# Patient Record
Sex: Female | Born: 1960 | Race: White | Hispanic: No | Marital: Married
Health system: Southern US, Community
[De-identification: ages and names within clinical notes are randomized; demographics above are authoritative.]

## PROBLEM LIST (undated history)

## (undated) DIAGNOSIS — I10 Essential (primary) hypertension: Secondary | ICD-10-CM

## (undated) DIAGNOSIS — R7303 Prediabetes: Secondary | ICD-10-CM

## (undated) HISTORY — PX: CHOLECYSTECTOMY: SHX55

## (undated) HISTORY — PX: OTHER SURGICAL HISTORY: SHX169

---

## 2005-02-20 ENCOUNTER — Ambulatory Visit: Payer: Self-pay | Admitting: Family Medicine

## 2006-04-16 ENCOUNTER — Ambulatory Visit: Payer: Self-pay | Admitting: Family Medicine

## 2007-05-04 ENCOUNTER — Other Ambulatory Visit: Payer: Self-pay

## 2007-05-04 ENCOUNTER — Emergency Department: Payer: Self-pay | Admitting: Emergency Medicine

## 2007-06-20 ENCOUNTER — Ambulatory Visit: Payer: Self-pay | Admitting: Gastroenterology

## 2007-06-21 ENCOUNTER — Ambulatory Visit: Payer: Self-pay | Admitting: Family Medicine

## 2008-02-18 IMAGING — CR RIGHT GREAT TOE
1 series · 3 of 3 positions shown · non-contrast
Comparison: none

REASON FOR EXAM: Trauma: swelling and pain R Great Toe
COMMENTS:   LMP: > one month ago

PROCEDURE:     DXR - DXR TOE GREAT (1ST DIGIT) RT PANDITA  - May 04, 2007 [DATE]
RESULT:     Comparison: No available comparison exam.

[Series 1: view not recorded · 0.17mm/px · 3 of 3 slices shown]
[im 1/3]
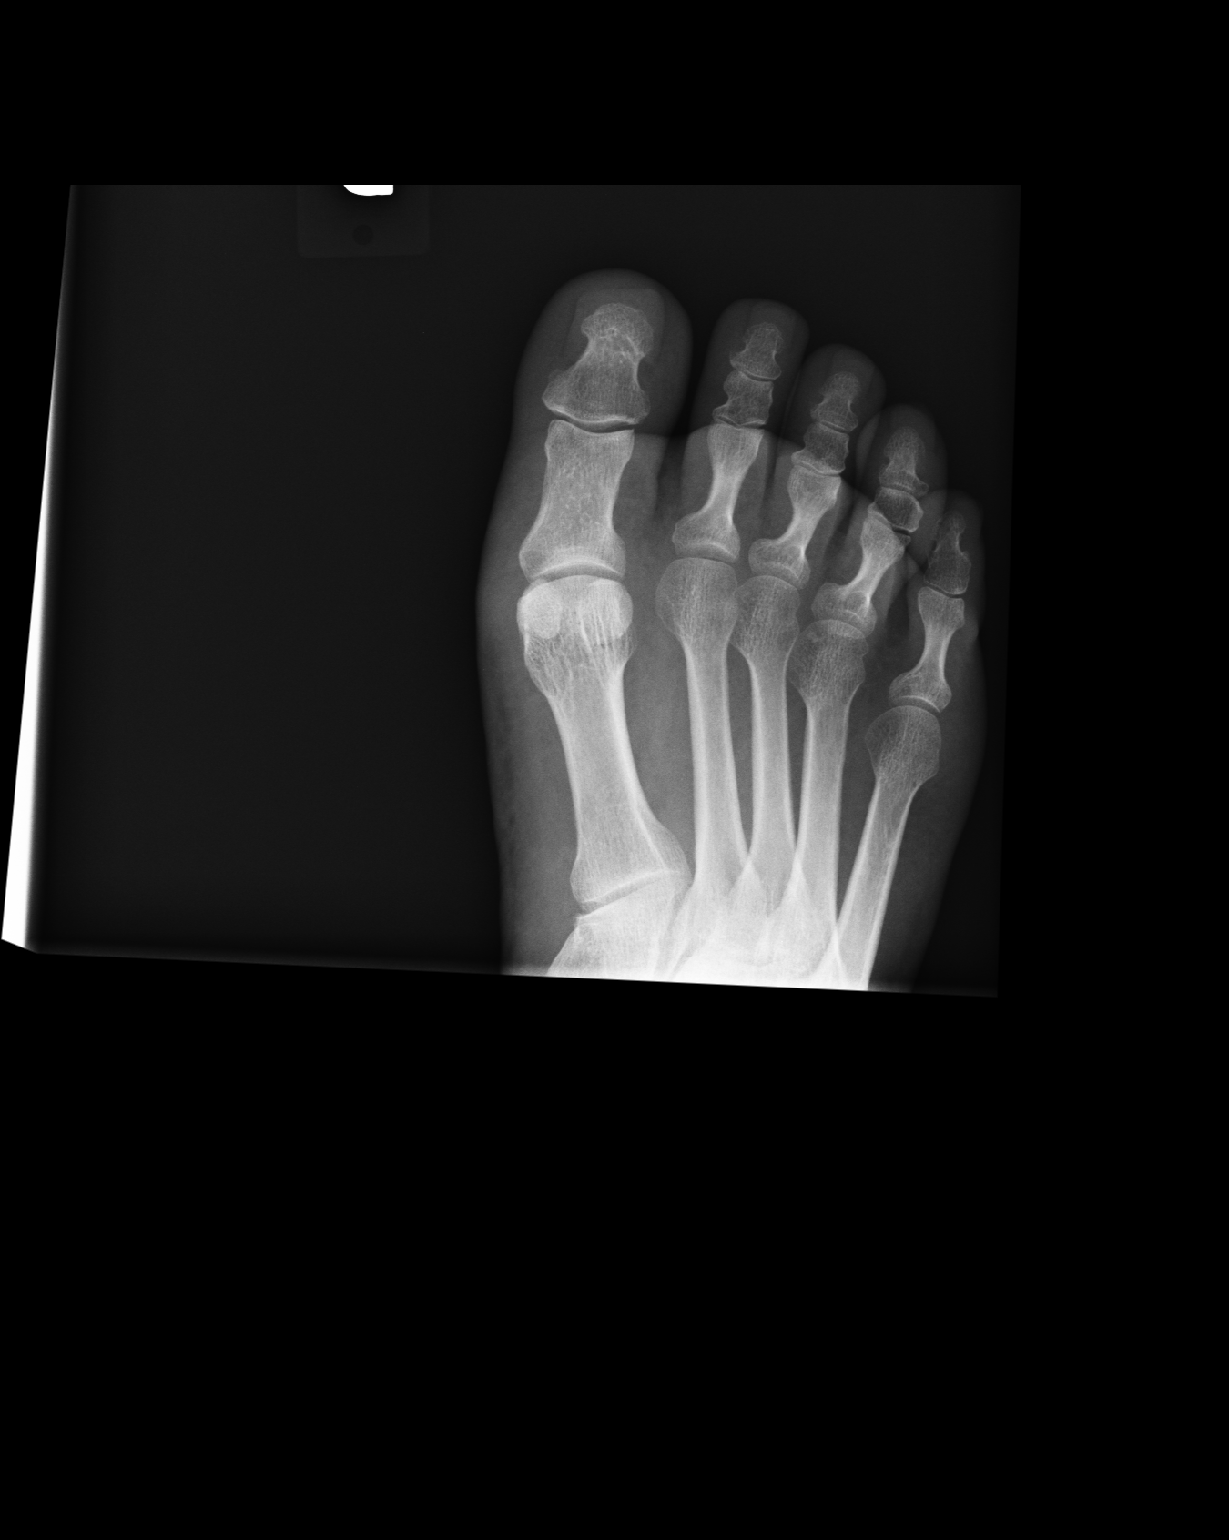
[im 2/3]
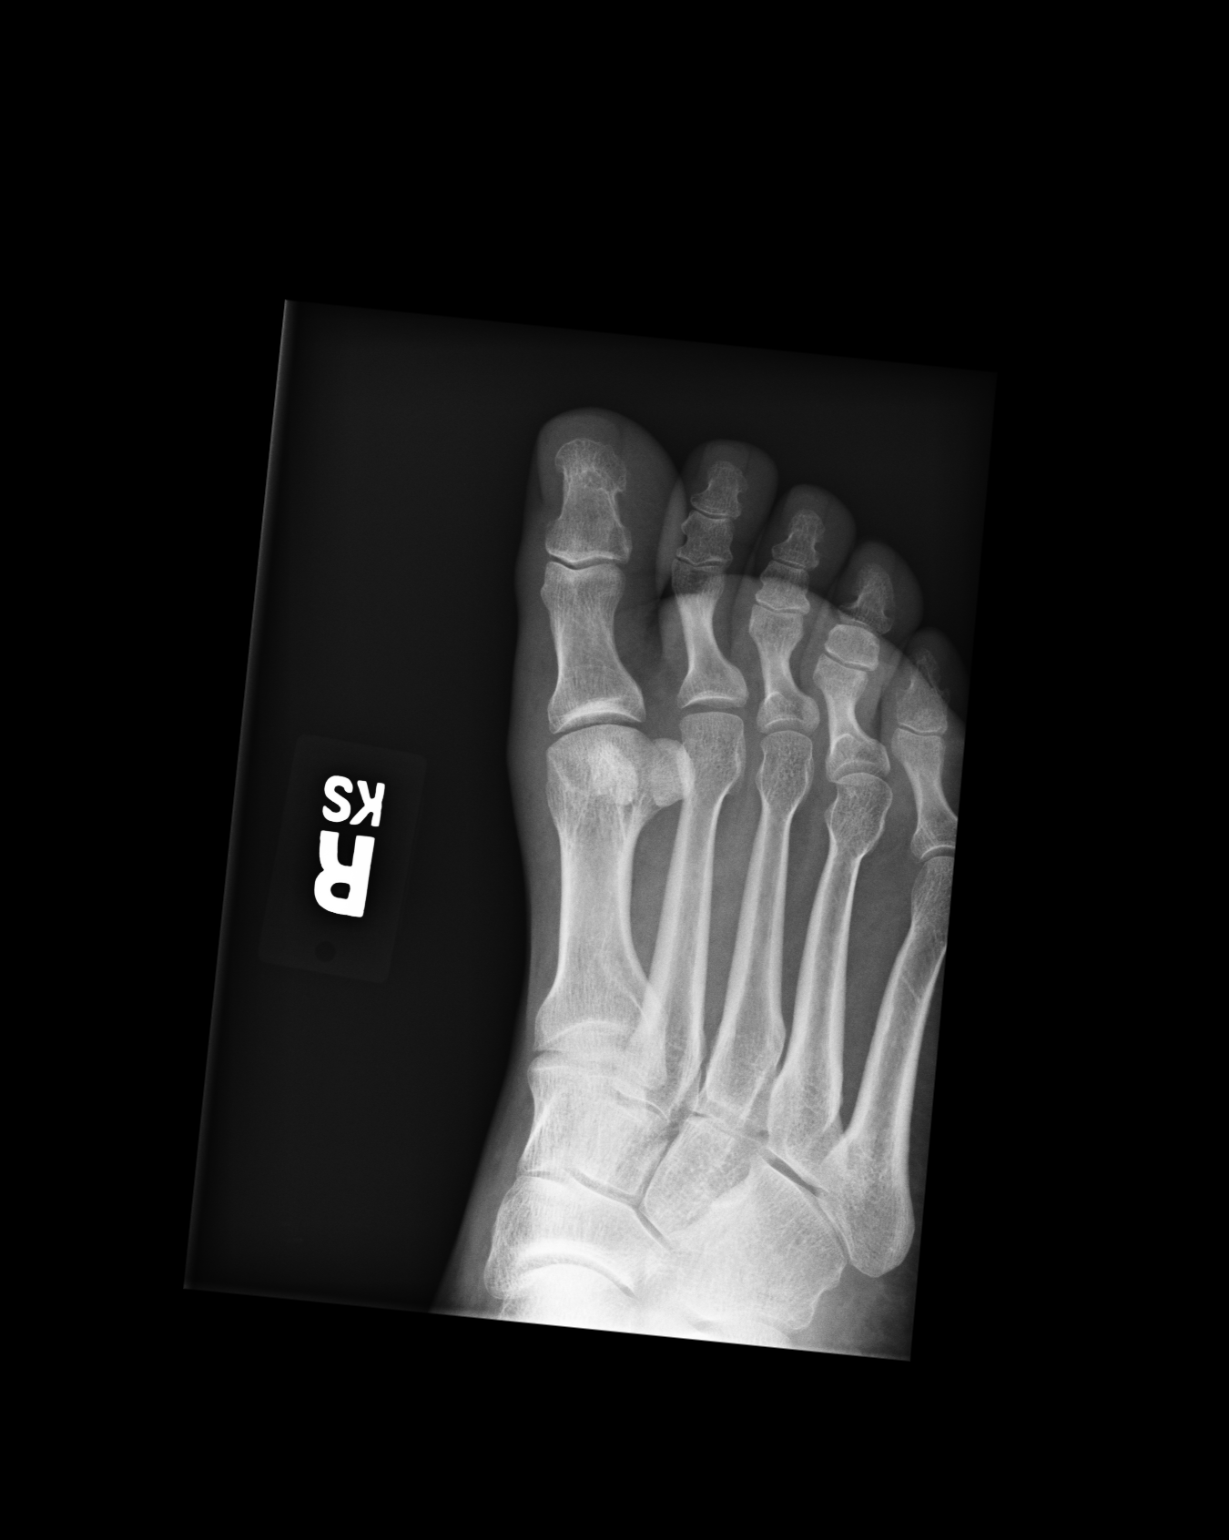
[im 3/3]
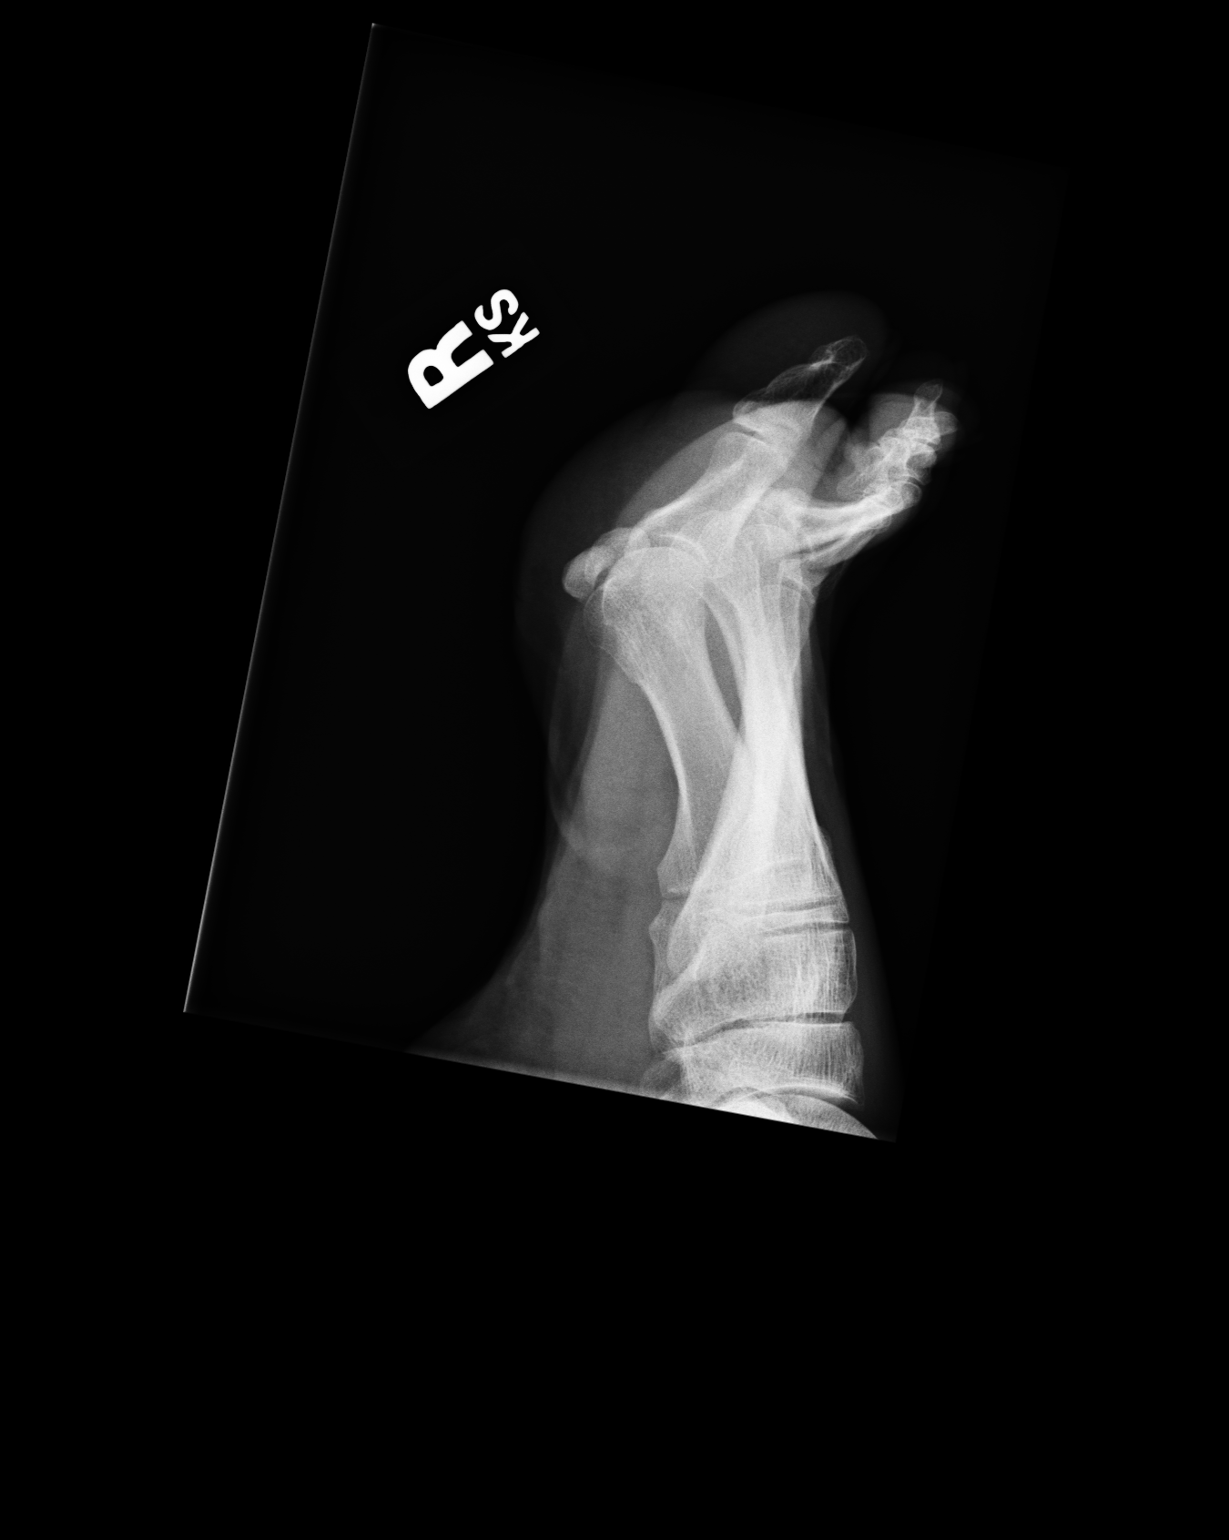

[3 of 3 positions shown; findings below may reference images not displayed]

FINDINGS: Three views of the right toes were obtained.

No fracture or dislocation of the right toes is noted. No significant joint
abnormality is seen.
IMPRESSION: 1. Please see findings.

## 2008-12-18 ENCOUNTER — Ambulatory Visit: Payer: Self-pay | Admitting: Family Medicine

## 2009-10-04 IMAGING — CR DG CHEST 2V
1 series · 2 of 2 positions shown · non-contrast
Comparison: none

REASON FOR EXAM: cough
COMMENTS:

[Series 1: view not recorded · 0.17mm/px · 2 of 2 slices shown]
[im 1/2]
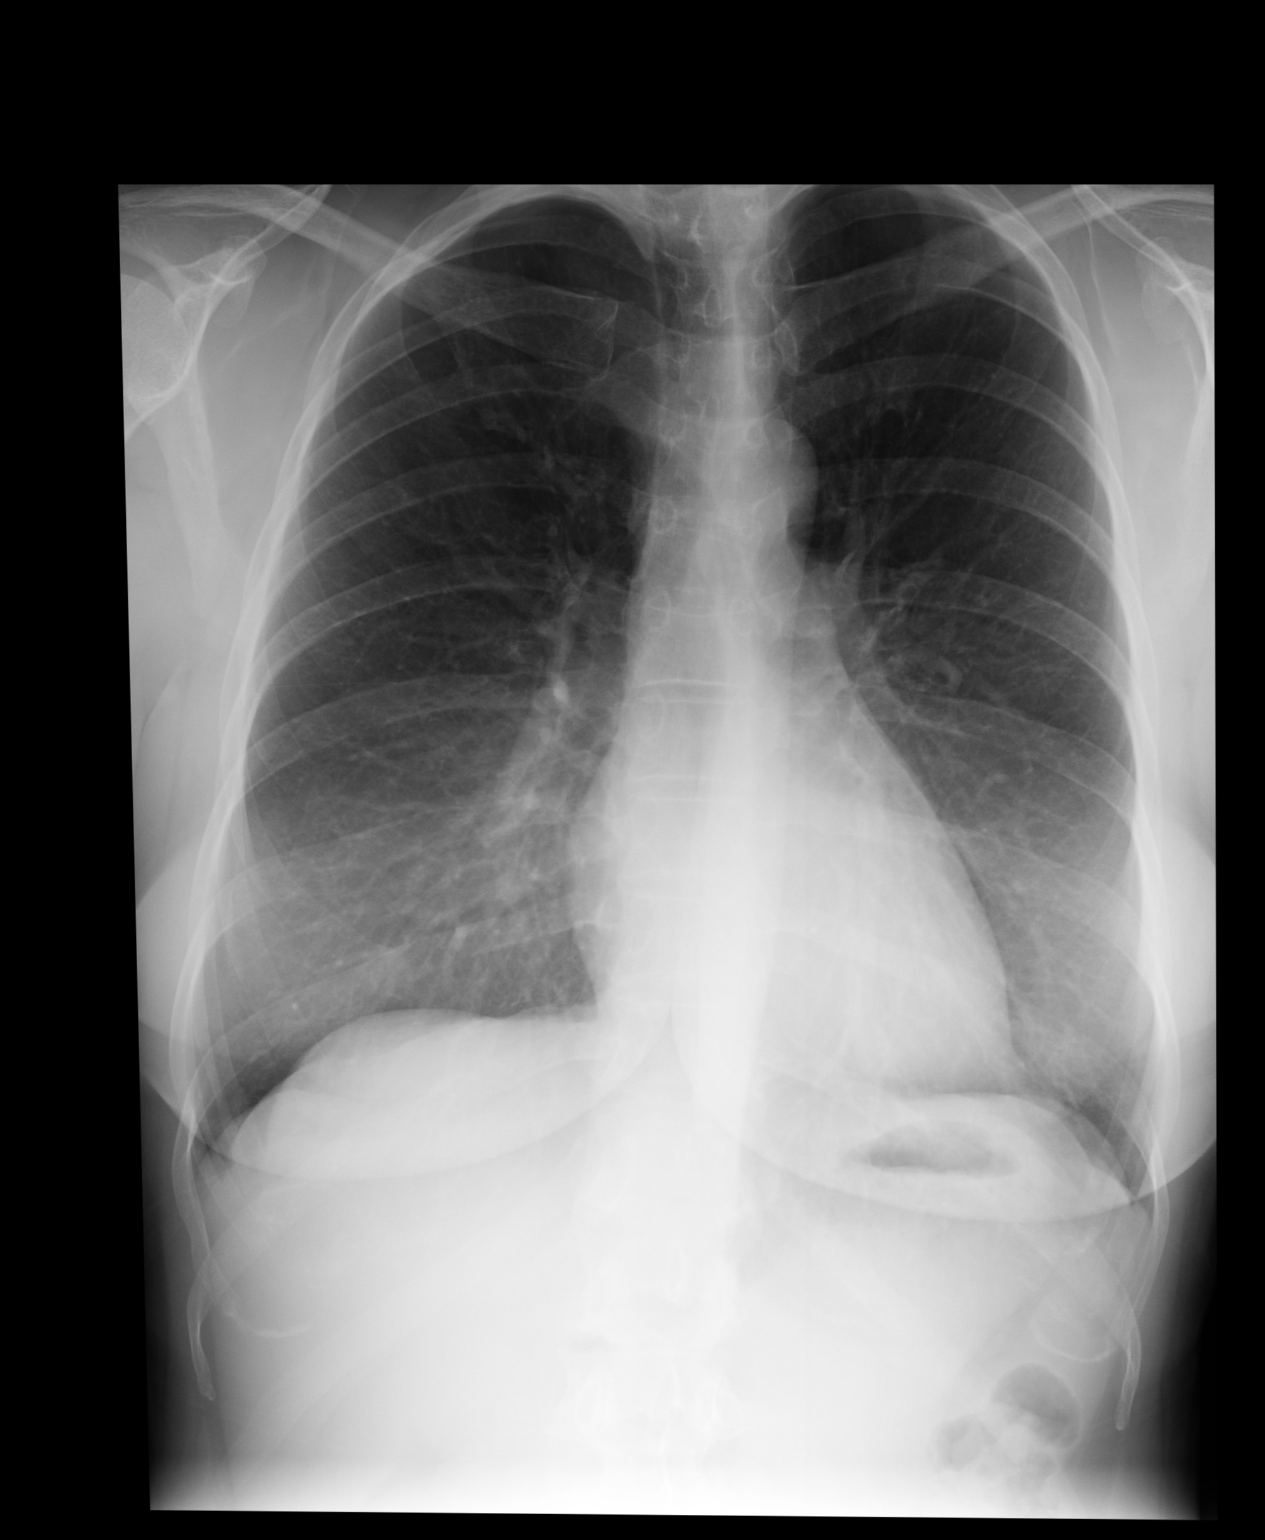
[im 2/2]
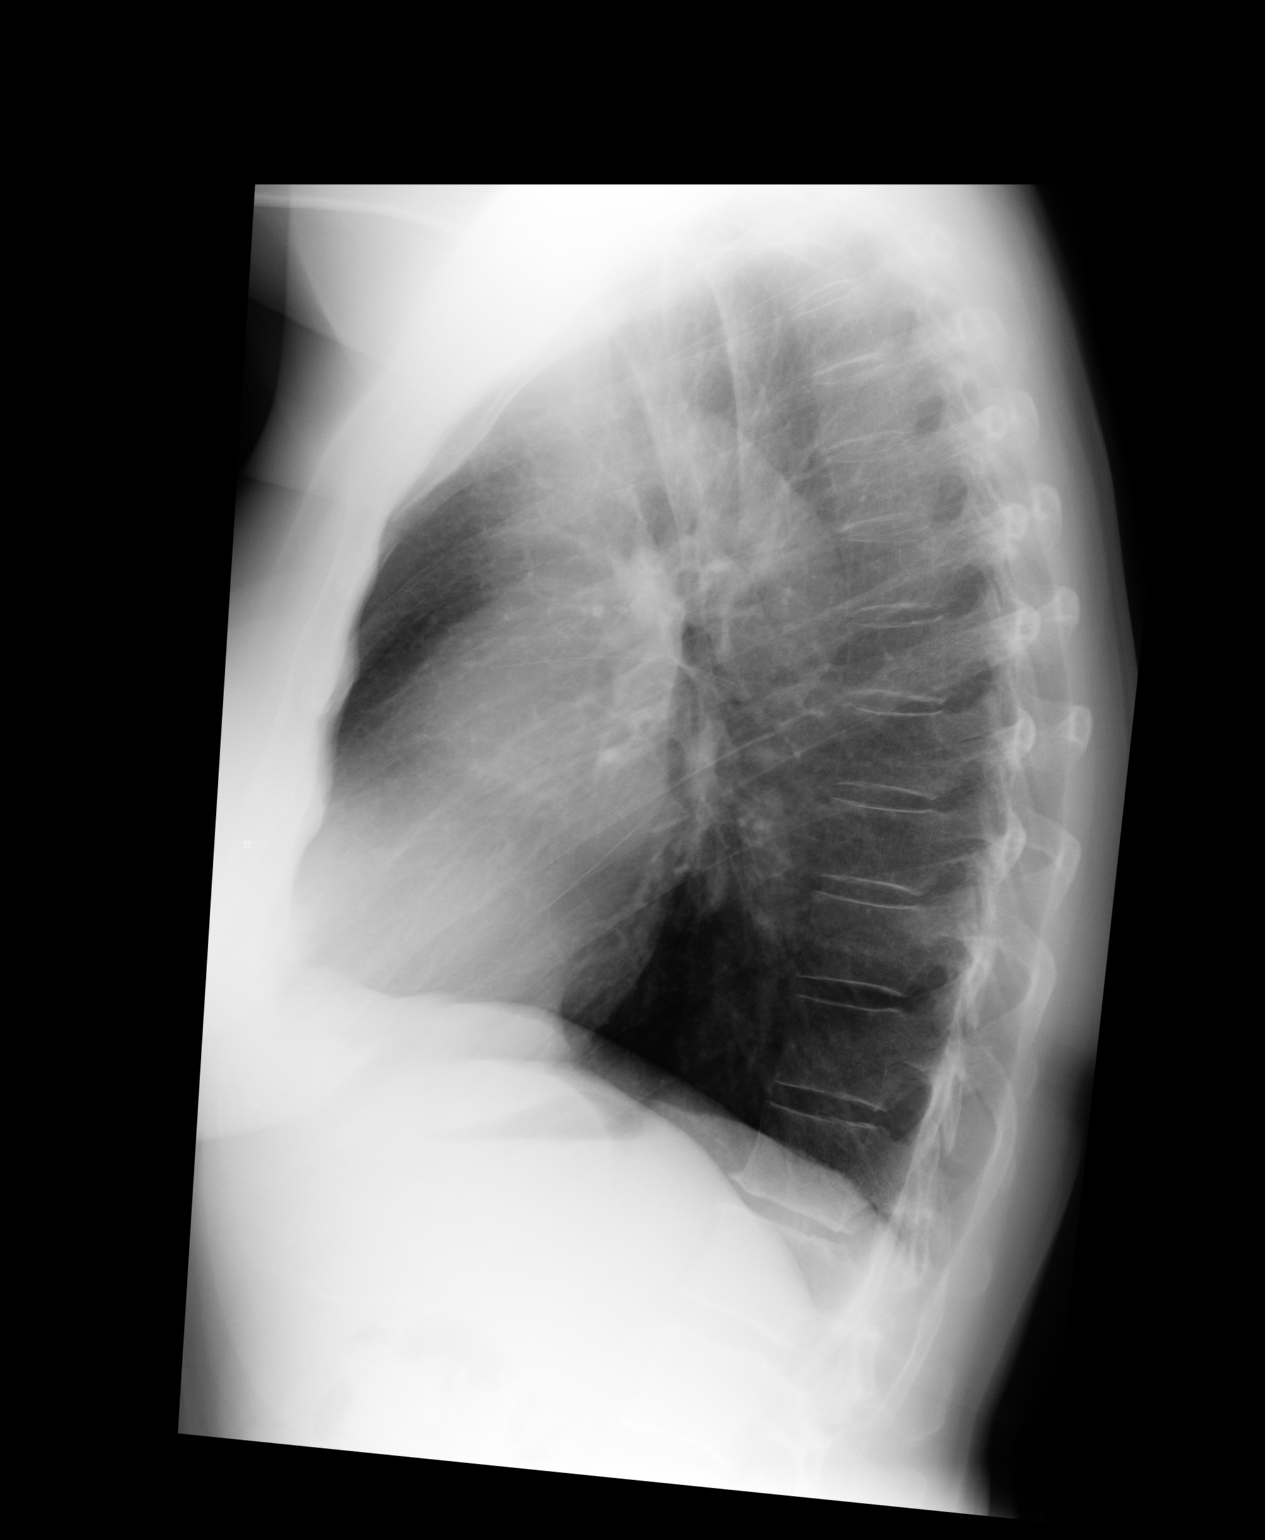

[2 of 2 positions shown; findings below may reference images not displayed]

PROCEDURE:     KDR - KDXR CHEST PA (OR AP) AND LAT  - December 18, 2008  [DATE]

RESULT:     The lung fields are clear. No pneumonia, pneumothorax or pleural
effusion is seen. The heart size is normal. No acute bony abnormalities are
seen.

Incidental note is made of a slight thoracic scoliosis with convexity to the
left.
IMPRESSION: 1.  The lung fields are clear.
2.  There is a slight thoracic scoliosis.
3.  Although not mentioned above, the chest appears mildly hyperexpanded
suspicious for a history of reactive airway disease or COPD.

## 2011-02-13 ENCOUNTER — Ambulatory Visit: Payer: Self-pay | Admitting: Family Medicine

## 2013-06-10 ENCOUNTER — Ambulatory Visit: Payer: Self-pay | Admitting: Internal Medicine

## 2014-03-27 IMAGING — US ABDOMEN ULTRASOUND LIMITED
1 series · 14 of 25 positions shown · non-contrast
Comparison: none

REASON FOR EXAM: Abnormal AST and ALT
COMMENTS:

[Series 1: abdomen ultrasound limited · 0.28mm/px · 14 of 35 slices shown]
[im 1/35]
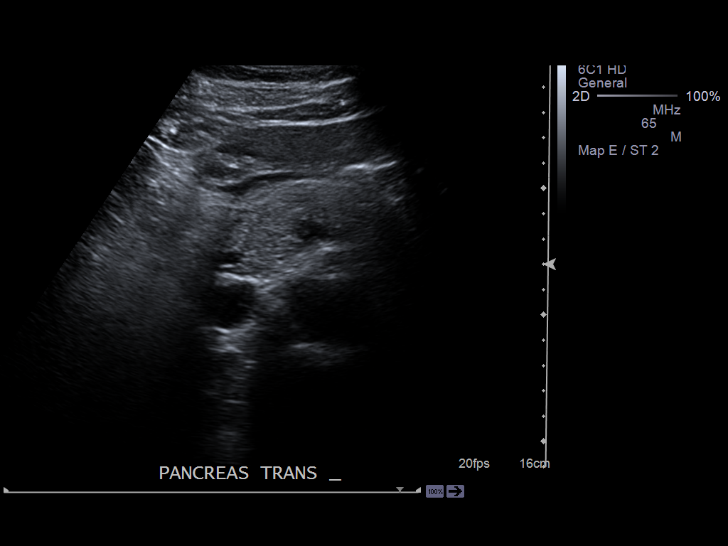
[im 3/35]
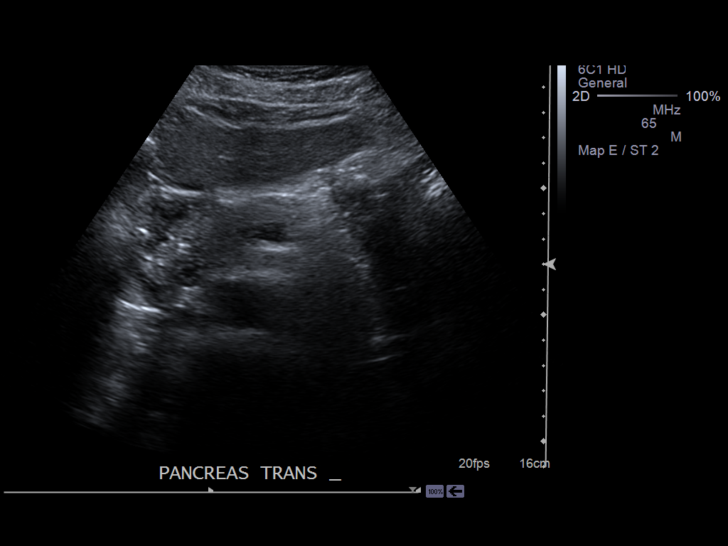
[im 6/35]
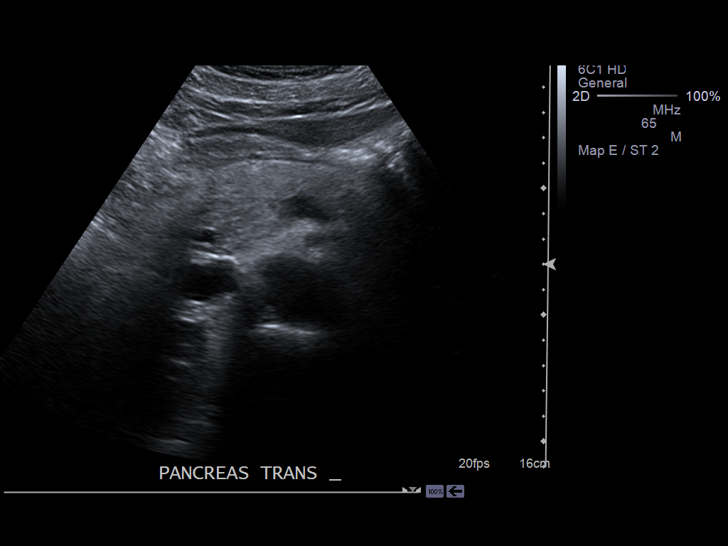
[im 9/35]
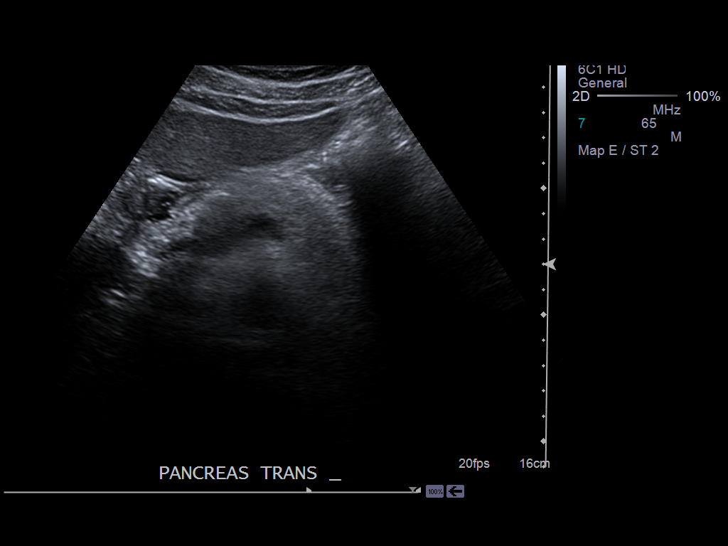
[im 12/35]
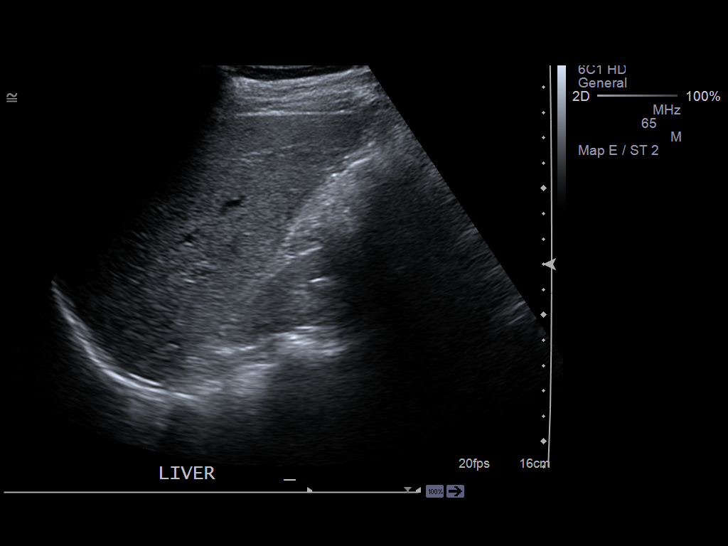
[im 13/35]
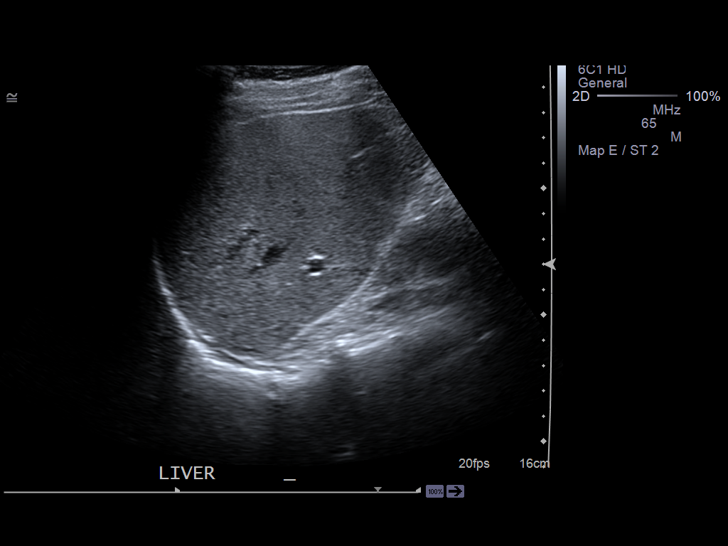
[im 16/35]
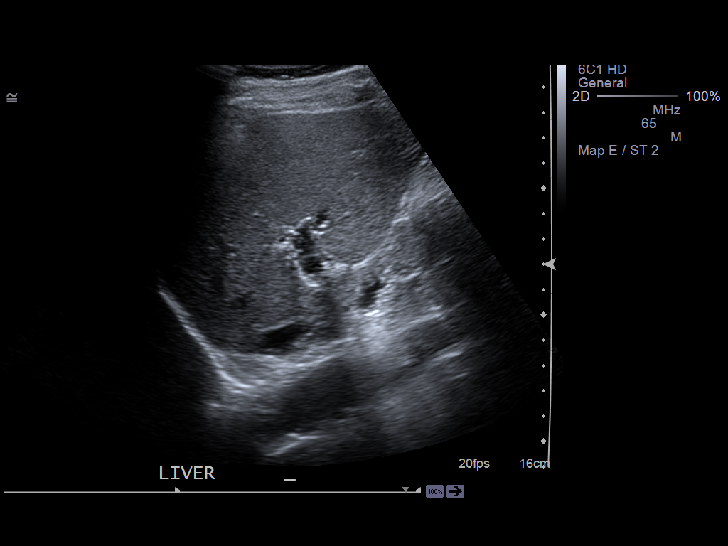
[im 19/35]
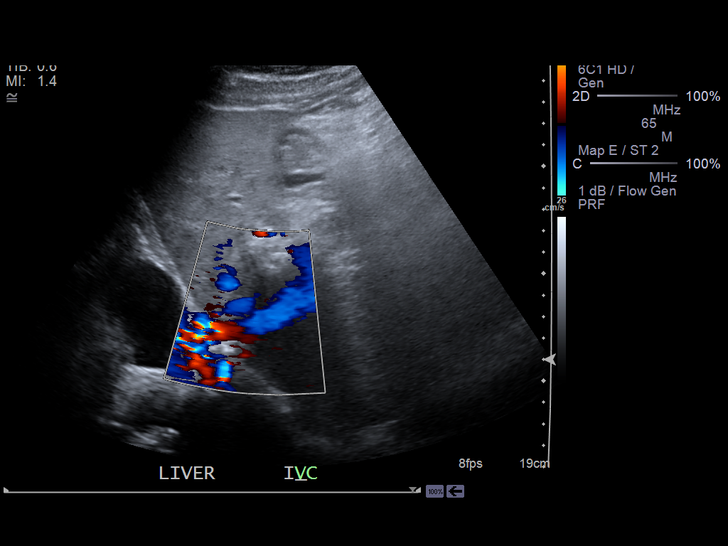
[im 22/35]
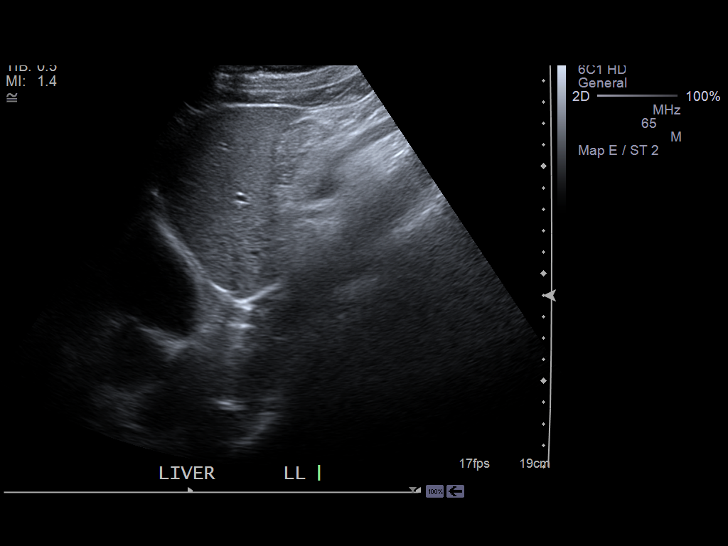
[im 23/35]
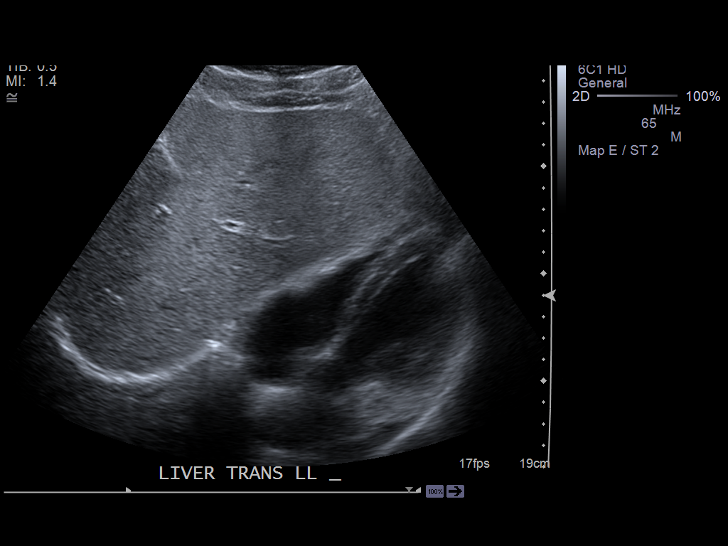
[im 26/35]
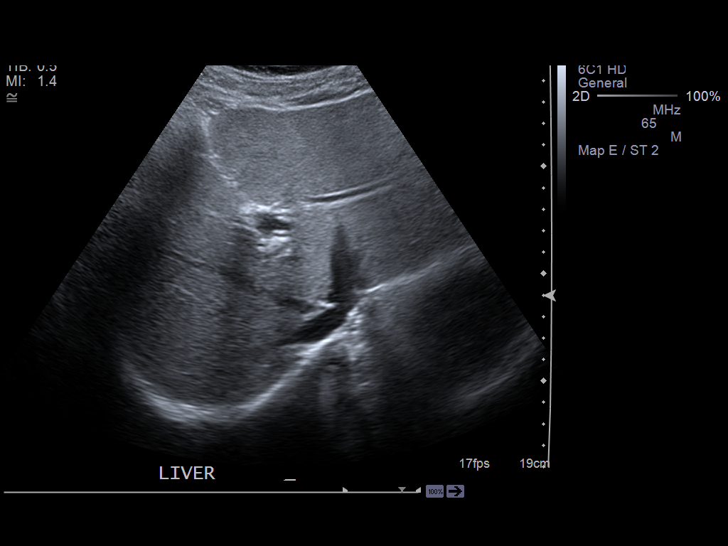
[im 29/35]
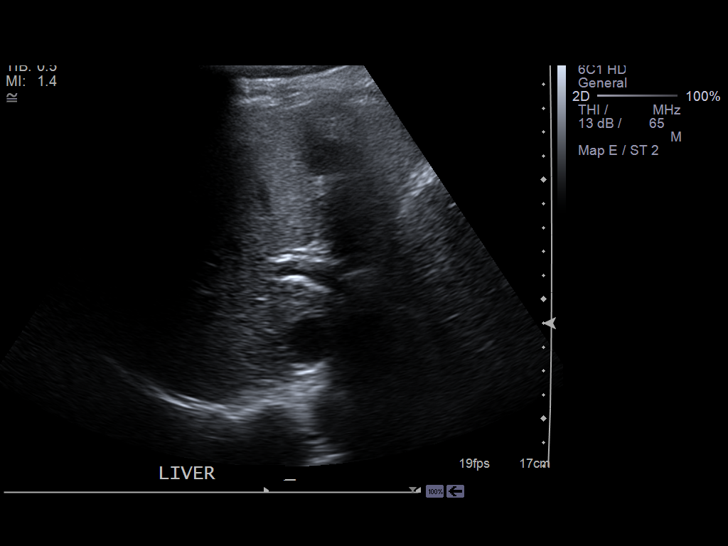
[im 32/35]
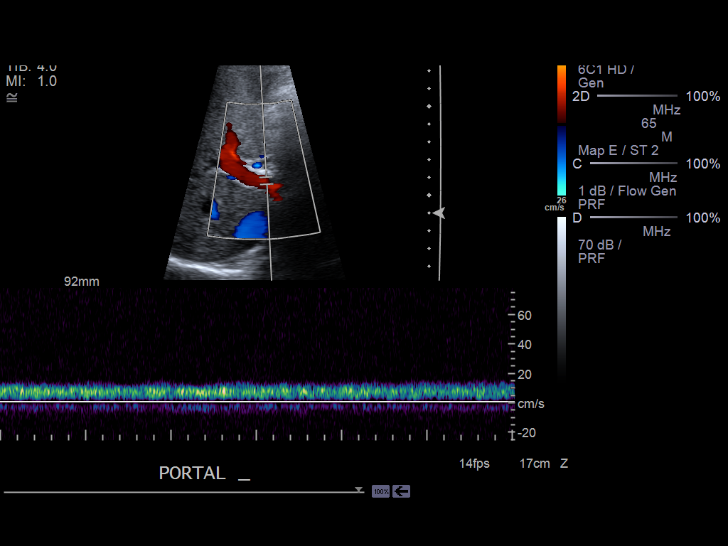
[im 35/35]
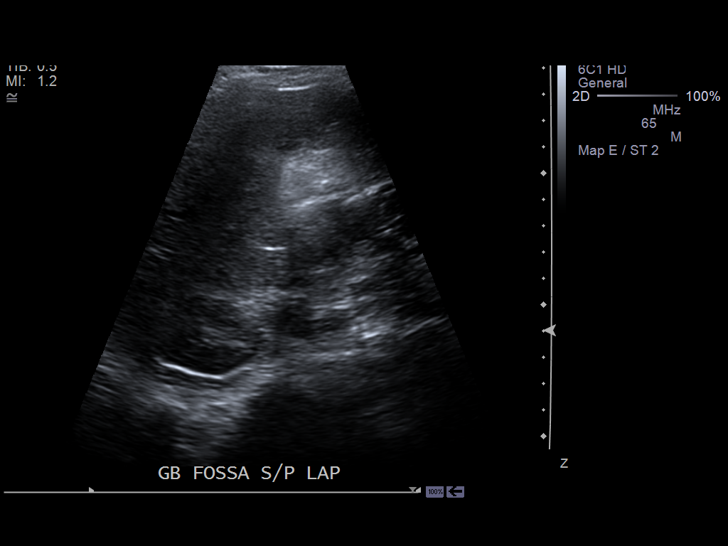

[14 of 25 positions shown; findings below may reference images not displayed]

PROCEDURE:     US  - US ABDOMEN LIMITED SURVEY  - June 10, 2013 [DATE]

RESULT:     Limited right upper quadrant abdominal ultrasound is performed.
The pancreas visualization is limited by overlying bowel gas. The included
portions of the head, neck and body appear to be grossly normal. The tail is
incompletely seen. The liver length is 14.22 cm. The hepatic echotexture is
normal. There is no intrahepatic biliary ductal dilation. The portal venous
flow is normal. Common bile duct diameter is 2.3 mm. There is no ascites.
The liver shows no masses or ductal dilation.
IMPRESSION: The patient has a history of laparoscopic cholecystectomy.
Limited visualization of the pancreas. Otherwise, normal appearing abdominal
ultrasound of the right upper quadrant.

[REDACTED]

## 2016-05-19 ENCOUNTER — Other Ambulatory Visit: Payer: Self-pay | Admitting: Obstetrics and Gynecology

## 2016-05-19 DIAGNOSIS — Z1231 Encounter for screening mammogram for malignant neoplasm of breast: Secondary | ICD-10-CM

## 2022-11-20 ENCOUNTER — Telehealth: Payer: Self-pay

## 2022-11-20 ENCOUNTER — Other Ambulatory Visit: Payer: Self-pay | Admitting: *Deleted

## 2022-11-20 DIAGNOSIS — Z1211 Encounter for screening for malignant neoplasm of colon: Secondary | ICD-10-CM

## 2022-11-20 MED ORDER — NA SULFATE-K SULFATE-MG SULF 17.5-3.13-1.6 GM/177ML PO SOLN: 1.0000 | Freq: Once | ORAL | 0 refills | Status: AC

## 2022-11-20 NOTE — Telephone Encounter (Signed)
Patient calling to schedule coloscopy.

## 2022-11-20 NOTE — Telephone Encounter (Signed)
Gastroenterology Pre-Procedure Review  Request Date: 01/12/2023 Requesting Physician: Dr. Allen Norris  PATIENT REVIEW QUESTIONS: The patient responded to the following health history questions as indicated:    1. Are you having any GI issues? yes (constipation somtimes) 2. Do you have a personal history of Polyps? no 3. Do you have a family history of Colon Cancer or Polyps? no 4. Diabetes Mellitus? no 5. Joint replacements in the past 12 months?no 6. Major health problems in the past 3 months?no 7. Any artificial heart valves, MVP, or defibrillator?no    MEDICATIONS & ALLERGIES:    Patient reports the following regarding taking any anticoagulation/antiplatelet therapy:   Plavix, Coumadin, Eliquis, Xarelto, Lovenox, Pradaxa, Brilinta, or Effient? no Aspirin? no  Patient confirms/reports the following medications:  Current Outpatient Medications  Medication Sig Dispense Refill   CALCIUM PO Take by mouth.     Cholecalciferol 10 MCG (400 UNIT) CHEW Chew by mouth.     hydrochlorothiazide (HYDRODIURIL) 12.5 MG tablet Take 12.5 mg by mouth daily.     losartan (COZAAR) 50 MG tablet Take 50 mg by mouth daily.     Na Sulfate-K Sulfate-Mg Sulf 17.5-3.13-1.6 GM/177ML SOLN Take 1 kit by mouth once for 1 dose. 354 mL 0   No current facility-administered medications for this visit.    Patient confirms/reports the following allergies:  Not on File  No orders of the defined types were placed in this encounter.   AUTHORIZATION INFORMATION Primary Insurance: 1D#: Group #:  Secondary Insurance: 1D#: Group #:  SCHEDULE INFORMATION: Date: 01/12/2023 Time: Location: [facility]

## 2022-11-20 NOTE — Addendum Note (Signed)
Addended by: Jacqualin Combes on: 11/20/2022 04:49 PM   Modules accepted: Orders

## 2023-01-03 ENCOUNTER — Encounter: Payer: Self-pay | Admitting: Gastroenterology

## 2023-01-09 MED ORDER — EPHEDRINE 5 MG/ML INJ
INTRAVENOUS | Status: AC
  Filled 2023-01-09: qty 15

## 2023-01-09 MED ORDER — SUCCINYLCHOLINE CHLORIDE 200 MG/10ML IV SOSY
PREFILLED_SYRINGE | INTRAVENOUS | Status: AC
  Filled 2023-01-09: qty 20

## 2023-01-09 MED ORDER — ONDANSETRON HCL 4 MG/2ML IJ SOLN
INTRAMUSCULAR | Status: AC
  Filled 2023-01-09: qty 10

## 2023-01-10 MED ORDER — PROPOFOL 1000 MG/100ML IV EMUL
INTRAVENOUS | Status: AC
  Filled 2023-01-10: qty 100

## 2023-01-11 MED ORDER — PHENYLEPHRINE 80 MCG/ML (10ML) SYRINGE FOR IV PUSH (FOR BLOOD PRESSURE SUPPORT)
PREFILLED_SYRINGE | INTRAVENOUS | Status: AC
  Filled 2023-01-11: qty 10

## 2023-01-11 MED ORDER — DEXMEDETOMIDINE HCL IN NACL 80 MCG/20ML IV SOLN
INTRAVENOUS | Status: AC
  Filled 2023-01-11: qty 20

## 2023-01-11 MED ORDER — PROPOFOL 10 MG/ML IV BOLUS
INTRAVENOUS | Status: AC
  Filled 2023-01-11: qty 20

## 2023-01-11 MED ORDER — LIDOCAINE HCL (PF) 2 % IJ SOLN
INTRAMUSCULAR | Status: AC
  Filled 2023-01-11: qty 20

## 2023-01-11 MED ORDER — LIDOCAINE HCL (PF) 2 % IJ SOLN
INTRAMUSCULAR | Status: AC
  Filled 2023-01-11: qty 5

## 2023-01-11 MED ORDER — PROPOFOL 1000 MG/100ML IV EMUL
INTRAVENOUS | Status: AC
  Filled 2023-01-11: qty 400

## 2023-01-11 MED ORDER — PENTAFLUOROPROP-TETRAFLUOROETH EX AERO
INHALATION_SPRAY | CUTANEOUS | Status: AC
  Filled 2023-01-11: qty 30

## 2023-01-12 ENCOUNTER — Other Ambulatory Visit: Payer: Self-pay

## 2023-01-12 ENCOUNTER — Other Ambulatory Visit
Admission: RE | Admit: 2023-01-12 | Discharge: 2023-01-12 | Disposition: A | Discharge status: Home / Self Care | Payer: BC Managed Care – PPO | Attending: Urgent Care | Admitting: Urgent Care

## 2023-01-12 ENCOUNTER — Ambulatory Visit: Payer: BC Managed Care – PPO | Admitting: Anesthesiology

## 2023-01-12 ENCOUNTER — Encounter
Admission: RE | Disposition: A | Discharge status: Home / Self Care | Payer: Self-pay | Source: Home / Self Care | Attending: Gastroenterology

## 2023-01-12 ENCOUNTER — Encounter: Payer: Self-pay | Admitting: Gastroenterology

## 2023-01-12 ENCOUNTER — Ambulatory Visit
Admission: RE | Admit: 2023-01-12 | Discharge: 2023-01-12 | Disposition: A | Discharge status: Home / Self Care | Payer: BC Managed Care – PPO | Attending: Gastroenterology | Admitting: Gastroenterology

## 2023-01-12 DIAGNOSIS — K635 Polyp of colon: Secondary | ICD-10-CM | POA: Diagnosis not present

## 2023-01-12 DIAGNOSIS — R519 Headache, unspecified: Secondary | ICD-10-CM | POA: Diagnosis not present

## 2023-01-12 DIAGNOSIS — D128 Benign neoplasm of rectum: Secondary | ICD-10-CM | POA: Insufficient documentation

## 2023-01-12 DIAGNOSIS — Z1211 Encounter for screening for malignant neoplasm of colon: Secondary | ICD-10-CM | POA: Diagnosis present

## 2023-01-12 DIAGNOSIS — F17210 Nicotine dependence, cigarettes, uncomplicated: Secondary | ICD-10-CM | POA: Diagnosis not present

## 2023-01-12 DIAGNOSIS — Z01818 Encounter for other preprocedural examination: Secondary | ICD-10-CM

## 2023-01-12 DIAGNOSIS — I10 Essential (primary) hypertension: Secondary | ICD-10-CM | POA: Diagnosis not present

## 2023-01-12 HISTORY — DX: Essential (primary) hypertension: I10

## 2023-01-12 HISTORY — DX: Prediabetes: R73.03

## 2023-01-12 HISTORY — PX: COLONOSCOPY WITH PROPOFOL: SHX5780

## 2023-01-12 LAB — POTASSIUM: Potassium: 4.3 mmol/L (ref 3.5–5.1)

## 2023-01-12 SURGERY — COLONOSCOPY WITH PROPOFOL
Anesthesia: General | Site: Rectum

## 2023-01-12 MED ORDER — LACTATED RINGERS IV SOLN: INTRAVENOUS | Status: DC

## 2023-01-12 MED ORDER — PROPOFOL 10 MG/ML IV BOLUS
INTRAVENOUS | Status: DC | PRN
  Administered 2023-01-12: 25 mg via INTRAVENOUS
  Administered 2023-01-12: 100 mg via INTRAVENOUS
  Administered 2023-01-12: 25 mg via INTRAVENOUS

## 2023-01-12 MED ORDER — SODIUM CHLORIDE 0.9 % IV SOLN: INTRAVENOUS | Status: DC

## 2023-01-12 SURGICAL SUPPLY — 21 items

## 2023-01-12 NOTE — H&P (Signed)
   Midge Minium, MD [facility] [address] [place], [place] [address] Phone: [phone] Fax : [phone]  Primary Care Physician:  [facility], [place] Primary Gastroenterologist:  Dr. Servando Snare  Pre-Procedure History & Physical: HPI:  Cindy Wilkinson is a 62 y.o. female is here for a screening colonoscopy.   Past Medical History:  Diagnosis Date   Hypertension    Pre-diabetes     Past Surgical History:  Procedure Laterality Date   CHOLECYSTECTOMY     tummy tuck      Prior to Admission medications   Medication Sig Start Date End Date Taking? Authorizing Provider  CALCIUM PO Take by mouth.   Yes [provider]  Cholecalciferol 10 MCG (400 UNIT) CHEW Chew by mouth.   Yes [provider]  hydrochlorothiazide (HYDRODIURIL) 12.5 MG tablet Take 12.5 mg by mouth daily.   Yes [provider]  losartan (COZAAR) 50 MG tablet Take 50 mg by mouth daily.   Yes [provider]    Allergies as of 11/20/2022   (Not on File)    History reviewed. No pertinent family history.  Social History   Socioeconomic History   Marital status: Married    Spouse name: Not on file   Number of children: Not on file   Years of education: Not on file   Highest education level: Not on file  Occupational History   Not on file  Tobacco Use   Smoking status: Not on file   Smokeless tobacco: Not on file  Substance and Sexual Activity   Alcohol use: Not on file   Drug use: Not on file   Sexual activity: Not on file  Other Topics Concern   Not on file  Social History Narrative   Not on file   Social Determinants of Health   Financial Resource Strain: Not on file  Food Insecurity: Not on file  Transportation Needs: Not on file  Physical Activity: Not on file  Stress: Not on file  Social Connections: Not on file  Intimate Partner Violence: Not on file    Review of Systems: See HPI, otherwise negative ROS  Physical Exam: BP 121/78   Temp 97.8  F (36.6 C) (Temporal)   Resp 12   Ht  (1.626 m)   Wt 68 kg   SpO2 99%   BMI 25.73 kg/m  General:   Alert,  pleasant and cooperative in NAD Head:  Normocephalic and atraumatic. Neck:  Supple; no masses or thyromegaly. Lungs:  Clear throughout to auscultation.    Heart:  Regular rate and rhythm. Abdomen:  Soft, nontender and nondistended. Normal bowel sounds, without guarding, and without rebound.   Neurologic:  Alert and  oriented x4;  grossly normal neurologically.  Impression/Plan: Cindy Wilkinson is now here to undergo a screening colonoscopy.  Risks, benefits, and alternatives regarding colonoscopy have been reviewed with the patient.  Questions have been answered.  All parties agreeable.

## 2023-01-12 NOTE — Anesthesia Postprocedure Evaluation (Signed)
Anesthesia Post Note  Patient: Cindy Wilkinson  Procedure(s) Performed: COLONOSCOPY WITH PROPOFOL (Rectum)  Patient location during evaluation: PACU Anesthesia Type: General Level of consciousness: awake and alert Pain management: pain level controlled Vital Signs Assessment: post-procedure vital signs reviewed and stable Respiratory status: spontaneous breathing, nonlabored ventilation, respiratory function stable and patient connected to nasal cannula oxygen Cardiovascular status: blood pressure returned to baseline and stable Postop Assessment: no apparent nausea or vomiting Anesthetic complications: no   No notable events documented.   Last Vitals:  Vitals:   01/12/23 0952 01/12/23 1000  BP: 103/70   Pulse: 79 75  Resp: 17   Temp: (!) 36.3 C (!) 36.3 C  SpO2: 92% 97%    Last Pain:  Vitals:   01/12/23 1000  TempSrc:   PainSc: 0-No pain                 Yon Schiffman C Nasteho Glantz

## 2023-01-12 NOTE — Transfer of Care (Signed)
Immediate Anesthesia Transfer of Care Note  Patient: Cindy Wilkinson  Procedure(s) Performed: COLONOSCOPY WITH PROPOFOL (Rectum)  Patient Location: PACU  Anesthesia Type: General  Level of Consciousness: awake, alert  and patient cooperative  Airway and Oxygen Therapy: Patient Spontanous Breathing and Patient connected to supplemental oxygen  Post-op Assessment: Post-op Vital signs reviewed, Patient's Cardiovascular Status Stable, Respiratory Function Stable, Patent Airway and No signs of Nausea or vomiting  Post-op Vital Signs: Reviewed and stable  Complications: No notable events documented.

## 2023-01-12 NOTE — Op Note (Signed)
[facility] Patient Name: Cindy Wilkinson Procedure Date: 01/12/2023 9:22 AM MRN: [id number] Account #: [id number] Date of Birth: [birth year] Admit Type: Outpatient Age: 62 Room: [facility] OR ROOM 01 Gender: Female Note Status: Finalized Instrument Name: [id number] Procedure:             Colonoscopy Indications:           Screening for colorectal malignant neoplasm Providers:             Midge Minium MD, MD Medicines:             Propofol per Anesthesia Complications:         No immediate complications. Procedure:             Pre-Anesthesia Assessment:                        - Prior to the procedure, a History and Physical was                         performed, and patient medications and allergies were                         reviewed. The patient's tolerance of previous                         anesthesia was also reviewed. The risks and benefits                         of the procedure and the sedation options and risks                         were discussed with the patient. All questions were                         answered, and informed consent was obtained. Prior                         Anticoagulants: The patient has taken no anticoagulant                         or antiplatelet agents. ASA Grade Assessment: II - A                         patient with mild systemic disease. After reviewing                         the risks and benefits, the patient was deemed in                         satisfactory condition to undergo the procedure.                        After obtaining informed consent, the colonoscope was                         passed under direct vision. Throughout the procedure,                         the patient's blood pressure,  pulse, and oxygen                         saturations were monitored continuously. The                         Colonoscope was introduced through the anus and                         advanced to the the cecum,  identified by appendiceal                         orifice and ileocecal valve. The colonoscopy was                         performed without difficulty. The patient tolerated                         the procedure well. The quality of the bowel                         preparation was excellent. Findings:      The perianal and digital rectal examinations were normal.      A 2 mm polyp was found in the rectum. The polyp was sessile. The polyp       was removed with a cold snare. Resection and retrieval were complete.      The exam was otherwise without abnormality. Impression:            - One 2 mm polyp in the rectum, removed with a cold                         snare. Resected and retrieved.                        - The examination was otherwise normal. Recommendation:        - Discharge patient to home.                        - Resume previous diet.                        - Continue present medications.                        - Await pathology results.                        - If the pathology report reveals adenomatous tissue,                         then repeat the colonoscopy for surveillance in 7                         years. Procedure Code(s):     --- Professional ---                        [phone], Colonoscopy, flexible; with removal of  tumor(s), polyp(s), or other lesion(s) by snare                         technique Diagnosis Code(s):     --- Professional ---                        Z12.11, Encounter for screening for malignant neoplasm                         of colon                        D12.8, Benign neoplasm of rectum CPT copyright 2022 [employer]. All rights reserved. The codes documented in this report are preliminary and upon coder review may  be revised to meet current compliance requirements. Midge Minium MD, MD 01/12/2023 9:54:12 AM This report has been signed electronically. Number of Addenda: 0 Note Initiated On: 01/12/2023  9:22 AM Scope Withdrawal Time: 0 hours 13 minutes 28 seconds  Total Procedure Duration: 0 hours 15 minutes 55 seconds  Estimated Blood Loss:  Estimated blood loss: none.      [facility]
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
## 2023-01-15 ENCOUNTER — Encounter: Payer: Self-pay | Admitting: Gastroenterology

## 2023-01-16 ENCOUNTER — Encounter: Payer: Self-pay | Admitting: Gastroenterology

## 2023-01-16 LAB — SURGICAL PATHOLOGY
